# Patient Record
Sex: Male | Born: 1990 | Race: White | Hispanic: No | Smoking: Never smoker
Health system: Southern US, Community
[De-identification: ages and names within clinical notes are randomized; demographics above are authoritative.]

## PROBLEM LIST (undated history)

## (undated) DIAGNOSIS — K219 Gastro-esophageal reflux disease without esophagitis: Secondary | ICD-10-CM

## (undated) HISTORY — PX: TONSILLECTOMY: SUR1361

## (undated) HISTORY — DX: Gastro-esophageal reflux disease without esophagitis: K21.9

---

## 2019-10-10 ENCOUNTER — Emergency Department: Payer: Self-pay

## 2019-10-10 ENCOUNTER — Encounter: Payer: Self-pay | Admitting: Emergency Medicine

## 2019-10-10 ENCOUNTER — Other Ambulatory Visit: Payer: Self-pay

## 2019-10-10 ENCOUNTER — Emergency Department
Admission: EM | Admit: 2019-10-10 | Discharge: 2019-10-10 | Disposition: A | Discharge status: Home / Self Care | Payer: Self-pay | Attending: Emergency Medicine | Admitting: Emergency Medicine

## 2019-10-10 DIAGNOSIS — M940 Chondrocostal junction syndrome [Tietze]: Secondary | ICD-10-CM | POA: Insufficient documentation

## 2019-10-10 DIAGNOSIS — R0789 Other chest pain: Secondary | ICD-10-CM

## 2019-10-10 LAB — BASIC METABOLIC PANEL
Anion gap: 3 — ABNORMAL LOW (ref 5–15)
BUN: 19 mg/dL (ref 6–20)
CO2: 27 mmol/L (ref 22–32)
Calcium: 9.2 mg/dL (ref 8.9–10.3)
Chloride: 110 mmol/L (ref 98–111)
Creatinine, Ser: 0.9 mg/dL (ref 0.61–1.24)
GFR calc Af Amer: >60 mL/min (ref >60)
GFR calc non Af Amer: >60 mL/min (ref >60)
Glucose, Bld: 96 mg/dL (ref 70–99)
Potassium: 3.9 mmol/L (ref 3.5–5.1)
Sodium: 140 mmol/L (ref 135–145)

## 2019-10-10 LAB — CBC
HCT: 38.4 % — ABNORMAL LOW (ref 39.0–52.0)
Hemoglobin: 11.9 g/dL — ABNORMAL LOW (ref 13.0–17.0)
MCH: 25.2 pg — ABNORMAL LOW (ref 26.0–34.0)
MCHC: 31 g/dL (ref 30.0–36.0)
MCV: 81.4 fL (ref 80.0–100.0)
Platelets: 397 10*3/uL (ref 150–400)
RBC: 4.72 MIL/uL (ref 4.22–5.81)
RDW: 15.4 % (ref 11.5–15.5)
WBC: 11.1 10*3/uL — ABNORMAL HIGH (ref 4.0–10.5)
nRBC: 0 % (ref 0.0–0.2)

## 2019-10-10 LAB — BRAIN NATRIURETIC PEPTIDE: B Natriuretic Peptide: 15 pg/mL (ref 0.0–100.0)

## 2019-10-10 LAB — TROPONIN I (HIGH SENSITIVITY): Troponin I (High Sensitivity): 3 ng/L (ref <18)

## 2019-10-10 MED ORDER — METHOCARBAMOL 500 MG PO TABS: 500.0000 mg | ORAL_TABLET | Freq: Three times a day (TID) | ORAL | 0 refills | Status: DC | PRN

## 2019-10-10 MED ORDER — IBUPROFEN 600 MG PO TABS: 600.0000 mg | ORAL_TABLET | Freq: Three times a day (TID) | ORAL | 0 refills | Status: AC

## 2019-10-10 NOTE — ED Provider Notes (Signed)
[facility] Emergency Department Provider Note  ____________________________________________   First MD Initiated Contact with Patient 10/10/19 1857     (approximate)  I have reviewed the triage vital signs and the nursing notes.   HISTORY  Chief Complaint Chest Pain    HPI Christian Larsen is a 28 y.o. male here with chest pain.  The patient states that he was at work today when he experienced acute onset of a sharp, positional, reproducible anterior chest pain.  He states that he had a stomach bug over the weekend and had several episodes of nausea and diarrhea, but this had resolved for the last 24 hours.  He went to work today.  While at work, he states he recently started a new job at [employer] where he does frequent heavy lifting. He states the pain is sharp, reproducible, and worse w/ palpation and movement. No alleviating factors. No cough. No fever. Pain has been constant btu intermittently gets worse w/ movement. Mild pain upon inspiration. No leg swelling. No h/o DVT. No family ACS, PE.       History reviewed. No pertinent past medical history.  There are no active problems to display for this patient.   Past Surgical History:  Procedure Laterality Date  . TONSILLECTOMY      Prior to Admission medications   Medication Sig Start Date End Date Taking? Authorizing Provider  ibuprofen (ADVIL) 600 MG tablet Take 1 tablet (600 mg total) by mouth 3 (three) times daily for 7 days. 10/10/19 10/17/19  Shaune PollackIsaacs, Emeril Stille, MD  methocarbamol (ROBAXIN) 500 MG tablet Take 1 tablet (500 mg total) by mouth every 8 (eight) hours as needed for muscle spasms. 10/10/19   Shaune PollackIsaacs, Angad Nabers, MD    Allergies Doxycycline, Codeine, Keflex [cephalexin], and Tequin [gatifloxacin]  No family history on file.  Social History Social History   Tobacco Use  . Smoking status: Never Smoker  . Smokeless tobacco: Never Used  Substance Use Topics  . Alcohol use: Never   Frequency: Never  . Drug use: Never    Review of Systems  Review of Systems  Constitutional: Negative for chills, fatigue and fever.  HENT: Negative for sore throat.   Respiratory: Positive for chest tightness. Negative for shortness of breath.   Cardiovascular: Positive for chest pain.  Gastrointestinal: Negative for abdominal pain.  Genitourinary: Negative for flank pain.  Musculoskeletal: Negative for neck pain.  Skin: Negative for rash and wound.  Allergic/Immunologic: Negative for immunocompromised state.  Neurological: Negative for weakness and numbness.  Hematological: Does not bruise/bleed easily.     ____________________________________________  PHYSICAL EXAM:      VITAL SIGNS: ED Triage Vitals  Enc Vitals Group     BP 10/10/19 1704 137/85     Pulse Rate 10/10/19 1704 81     Resp 10/10/19 1704 20     Temp 10/10/19 1704 98.5 F (36.9 C)     Temp Source 10/10/19 1704 Oral     SpO2 10/10/19 1704 97 %     Weight 10/10/19 1700 (!) 330 lb (149.7 kg)     Height 10/10/19 1700 5\' 9"  (1.753 m)     Head Circumference --      Peak Flow --      Pain Score 10/10/19 1659 6     Pain Loc --      Pain Edu? --      Excl. in GC? --      Physical Exam Vitals signs and nursing note reviewed.  Constitutional:      General: He is not in acute distress.    Appearance: He is well-developed.  HENT:     Head: Normocephalic and atraumatic.  Eyes:     Conjunctiva/sclera: Conjunctivae normal.  Neck:     Musculoskeletal: Neck supple.  Cardiovascular:     Rate and Rhythm: Normal rate and regular rhythm.     Heart sounds: Normal heart sounds. No murmur. No friction rub.  Pulmonary:     Effort: Pulmonary effort is normal. No respiratory distress.     Breath sounds: No decreased breath sounds, wheezing or rales.  Chest:     Chest wall: Tenderness (Significant pinpoint intercostal TTP over left anterior chest walll) present.  Abdominal:     General: There is no distension.      Palpations: Abdomen is soft.     Tenderness: There is no abdominal tenderness.  Skin:    General: Skin is warm.     Capillary Refill: Capillary refill takes less than 2 seconds.  Neurological:     Mental Status: He is alert and oriented to person, place, and time.     Motor: No abnormal muscle tone.       ____________________________________________   LABS (all labs ordered are listed, but only abnormal results are displayed)  Labs Reviewed  BASIC METABOLIC PANEL - Abnormal; Notable for the following components:      Result Value   Anion gap 3 (*)    All other components within normal limits  CBC - Abnormal; Notable for the following components:   WBC 11.1 (*)    Hemoglobin 11.9 (*)    HCT 38.4 (*)    MCH 25.2 (*)    All other components within normal limits  BRAIN NATRIURETIC PEPTIDE  TROPONIN I (HIGH SENSITIVITY)    ____________________________________________  EKG: Normal sinus rhythm, VR 83. PR 144, QRS 86, QTc 434. No ischemic changes. No ST elevations or depressions ________________________________________  RADIOLOGY All imaging, including plain films, CT scans, and ultrasounds, independently reviewed by me, and interpretations confirmed via formal radiology reads.  ED MD interpretation:   CXR: No focal abnormality  Official radiology report(s): Dg Chest 2 View  Result Date: 10/10/2019 CLINICAL DATA:  28 year old male with chest pain. EXAM: CHEST - 2 VIEW COMPARISON:  None. FINDINGS: Top-normal cardiac size. There is diffuse vascular prominence. No focal consolidation, pleural effusion, or pneumothorax. No acute osseous pathology. IMPRESSION: Mild vascular congestion.  No focal consolidation. Electronically Signed   By: Anner Crete M.D.   On: 10/10/2019 18:03    ____________________________________________  PROCEDURES   Procedure(s) performed (including Critical Care):  Procedures  ____________________________________________  INITIAL IMPRESSION  / MDM / [place] / ED COURSE  As part of my medical decision making, I reviewed the following data within the electronic MEDICAL RECORD NUMBER Notes from prior ED visits and [place] Controlled Substance Database      *Taiden Raybourn was evaluated in Emergency Department on 10/10/2019 for the symptoms described in the history of present illness. He was evaluated in the context of the global COVID-19 pandemic, which necessitated consideration that the patient might be at risk for infection with the SARS-CoV-2 virus that causes COVID-19. Institutional protocols and algorithms that pertain to the evaluation of patients at risk for COVID-19 are in a state of rapid change based on information released by regulatory bodies including the CDC and federal and state organizations. These policies and algorithms were followed during the patient's care in the ED.  Some ED evaluations and interventions may be delayed as a result of limited staffing during the pandemic.*      Medical Decision Making: 28 year old male here with reproducible anterior chest wall pain.  I suspect this is due to increased activity at work with a new job, versus straining from recent GI illness.  His abdomen is soft and he has no ongoing abdominal pain.  His pain is sharp, reproducible, and correlates with movement.  Suspect costochondritis versus intercostal sprain.  His chest x-ray is clear with no evidence of pneumonia or pneumothorax.  He has no tachycardia, hypoxia, or evidence to suggest PE and he is PERC negative.  Troponin negative despite constant symptoms for greater than 12 hours making ACS unlikely.  Will treat for possible musculoskeletal etiology, discharged with outpatient follow-up.  Scheduled NSAIDs given.  ____________________________________________  FINAL CLINICAL IMPRESSION(S) / ED DIAGNOSES  Final diagnoses:  Costochondritis  Chest wall pain     MEDICATIONS GIVEN DURING THIS VISIT:  Medications - No data  to display   ED Discharge Orders         Ordered    ibuprofen (ADVIL) 600 MG tablet  3 times daily     10/10/19 1918    methocarbamol (ROBAXIN) 500 MG tablet  Every 8 hours PRN     10/10/19 1918           Note:  This document was prepared using Dragon voice recognition software and may include unintentional dictation errors.   Shaune Pollack, MD 10/10/19 2043

## 2019-10-10 NOTE — ED Triage Notes (Signed)
Pt in via POV from work, reports intermittent centralized chest pain x one day, reports associated nausea, and shortness of breath.  NAD noted at this time.

## 2019-10-10 NOTE — ED Notes (Signed)
Pt able to transfer from wheelchair to bed without assistance. Pt reporting pain is worse when he moves his head to the left or looks down. Pain is not reproducible and pt not sure if it is worse with all movement but rather just the two previously mentioned. Intermittent SOB per pt but not at this time. NO increased WOB. No cardiac hx.

## 2019-10-10 NOTE — ED Notes (Signed)
PT signed paper copy of dispo d/t no computer available.

## 2020-09-14 ENCOUNTER — Other Ambulatory Visit: Payer: Self-pay

## 2020-09-14 ENCOUNTER — Ambulatory Visit (LOCAL_COMMUNITY_HEALTH_CENTER): Payer: Self-pay

## 2020-09-14 DIAGNOSIS — Z23 Encounter for immunization: Secondary | ICD-10-CM

## 2020-09-14 NOTE — Progress Notes (Signed)
[employer] employee in for [employer] A vaccine. Richmond Campbell, RN

## 2021-04-17 ENCOUNTER — Emergency Department
Admission: EM | Admit: 2021-04-17 | Discharge: 2021-04-17 | Disposition: A | Discharge status: Home / Self Care | Payer: Self-pay | Attending: Emergency Medicine | Admitting: Emergency Medicine

## 2021-04-17 ENCOUNTER — Other Ambulatory Visit: Payer: Self-pay

## 2021-04-17 ENCOUNTER — Emergency Department: Payer: Self-pay

## 2021-04-17 DIAGNOSIS — L03116 Cellulitis of left lower limb: Secondary | ICD-10-CM | POA: Insufficient documentation

## 2021-04-17 DIAGNOSIS — L03119 Cellulitis of unspecified part of limb: Secondary | ICD-10-CM

## 2021-04-17 DIAGNOSIS — L03115 Cellulitis of right lower limb: Secondary | ICD-10-CM | POA: Insufficient documentation

## 2021-04-17 DIAGNOSIS — K219 Gastro-esophageal reflux disease without esophagitis: Secondary | ICD-10-CM | POA: Insufficient documentation

## 2021-04-17 LAB — CBC
HCT: 38.8 % — ABNORMAL LOW (ref 39.0–52.0)
Hemoglobin: 11.9 g/dL — ABNORMAL LOW (ref 13.0–17.0)
MCH: 24.7 pg — ABNORMAL LOW (ref 26.0–34.0)
MCHC: 30.7 g/dL (ref 30.0–36.0)
MCV: 80.5 fL (ref 80.0–100.0)
Platelets: 405 10*3/uL — ABNORMAL HIGH (ref 150–400)
RBC: 4.82 MIL/uL (ref 4.22–5.81)
RDW: 15.8 % — ABNORMAL HIGH (ref 11.5–15.5)
WBC: 11.9 10*3/uL — ABNORMAL HIGH (ref 4.0–10.5)
nRBC: 0 % (ref 0.0–0.2)

## 2021-04-17 LAB — BASIC METABOLIC PANEL
Anion gap: 9 (ref 5–15)
BUN: 19 mg/dL (ref 6–20)
CO2: 28 mmol/L (ref 22–32)
Calcium: 9.4 mg/dL (ref 8.9–10.3)
Chloride: 100 mmol/L (ref 98–111)
Creatinine, Ser: 0.85 mg/dL (ref 0.61–1.24)
GFR, Estimated: >60 mL/min (ref >60)
Glucose, Bld: 107 mg/dL — ABNORMAL HIGH (ref 70–99)
Potassium: 4.2 mmol/L (ref 3.5–5.1)
Sodium: 137 mmol/L (ref 135–145)

## 2021-04-17 LAB — TROPONIN I (HIGH SENSITIVITY): Troponin I (High Sensitivity): 8 ng/L (ref <18)

## 2021-04-17 MED ORDER — SULFAMETHOXAZOLE-TRIMETHOPRIM 800-160 MG PO TABS: 1.0000 | ORAL_TABLET | Freq: Two times a day (BID) | ORAL | 0 refills | Status: DC

## 2021-04-17 MED ORDER — AMOXICILLIN 875 MG PO TABS: 875.0000 mg | ORAL_TABLET | Freq: Two times a day (BID) | ORAL | 0 refills | Status: DC

## 2021-04-17 MED ORDER — METOCLOPRAMIDE HCL 10 MG PO TABS: 10.0000 mg | ORAL_TABLET | Freq: Four times a day (QID) | ORAL | 0 refills | Status: DC | PRN

## 2021-04-17 MED ORDER — PANTOPRAZOLE SODIUM 40 MG IV SOLR
40.0000 mg | Freq: Once | INTRAVENOUS | Status: AC
  Administered 2021-04-17: 40 mg via INTRAVENOUS
  Filled 2021-04-17: qty 40

## 2021-04-17 MED ORDER — METOCLOPRAMIDE HCL 5 MG/ML IJ SOLN
10.0000 mg | Freq: Once | INTRAMUSCULAR | Status: AC
  Administered 2021-04-17: 10 mg via INTRAVENOUS
  Filled 2021-04-17: qty 2

## 2021-04-17 MED ORDER — SUCRALFATE 1 G PO TABS
1.0000 g | ORAL_TABLET | Freq: Once | ORAL | Status: AC
  Administered 2021-04-17: 1 g via ORAL
  Filled 2021-04-17: qty 1

## 2021-04-17 MED ORDER — FAMOTIDINE 20 MG PO TABS
40.0000 mg | ORAL_TABLET | Freq: Once | ORAL | Status: AC
  Administered 2021-04-17: 40 mg via ORAL
  Filled 2021-04-17: qty 2

## 2021-04-17 MED ORDER — FAMOTIDINE 20 MG PO TABS
20.0000 mg | ORAL_TABLET | Freq: Two times a day (BID) | ORAL | 0 refills | Status: DC
Start: 2021-04-17 — End: 2023-10-29

## 2021-04-17 NOTE — ED Provider Notes (Signed)
[facility] Emergency Department Provider Note  ____________________________________________  Time seen: Approximately 1:12 PM  I have reviewed the triage vital signs and the nursing notes.   HISTORY  Chief Complaint Chest Pain (Reports started after first dose of cleocin for cellulitis)    HPI Christian Larsen is a 30 y.o. male with no significant past medical history who comes the ED complaining of chest pain  which started yesterday, approximately 12 hours ago.  Constant, nonradiating, no aggravating or alleviating factors.  Waxing and waning.  Not pleuritic, not exertional.  No shortness of breath diaphoresis or vomiting.  No palpitations dizziness or syncope.  Feels like burning, feels like GERD, but did not resolve with Tums like his GERD normally does.  Clindamycin was prescribed for him due to cellulitis of bilateral lower extremities.  He notes that he has had chronic lower extremity swelling which gets worse throughout the day while being up on his feet working at [employer].     No past medical history on file.   There are no problems to display for this patient.    Past Surgical History:  Procedure Laterality Date  . TONSILLECTOMY       Prior to Admission medications   Medication Sig Start Date End Date Taking? Authorizing Provider  amoxicillin (AMOXIL) 875 MG tablet Take 1 tablet (875 mg total) by mouth 2 (two) times daily. 04/17/21  Yes Sharman Cheek, MD  famotidine (PEPCID) 20 MG tablet Take 1 tablet (20 mg total) by mouth 2 (two) times daily. 04/17/21  Yes Sharman Cheek, MD  metoCLOPramide (REGLAN) 10 MG tablet Take 1 tablet (10 mg total) by mouth every 6 (six) hours as needed. 04/17/21  Yes Sharman Cheek, MD  sulfamethoxazole-trimethoprim (BACTRIM DS) 800-160 MG tablet Take 1 tablet by mouth 2 (two) times daily. 04/17/21  Yes Sharman Cheek, MD  methocarbamol (ROBAXIN) 500 MG tablet Take 1 tablet (500 mg total) by mouth  every 8 (eight) hours as needed for muscle spasms. 10/10/19   Shaune Pollack, MD     Allergies Doxycycline, Codeine, Keflex [cephalexin], and Tequin [gatifloxacin]   No family history on file.  Social History Social History   Tobacco Use  . Smoking status: Never Smoker  . Smokeless tobacco: Never Used  Vaping Use  . Vaping Use: Never used  Substance Use Topics  . Alcohol use: Never  . Drug use: Never    Review of Systems  Constitutional:   No fever or chills.  ENT:   No sore throat. No rhinorrhea. Cardiovascular: Positive chest pain without syncope. Respiratory:   No dyspnea or cough. Gastrointestinal:   Negative for abdominal pain, vomiting and diarrhea.  Musculoskeletal:   Chronic lower extremity swelling. All other systems reviewed and are negative except as documented above in ROS and HPI.  ____________________________________________   PHYSICAL EXAM:  VITAL SIGNS: ED Triage Vitals  Enc Vitals Group     BP 04/17/21 1026 114/66     Pulse Rate 04/17/21 1037 91     Resp 04/17/21 1026 20     Temp 04/17/21 1026 97.9 F (36.6 C)     Temp Source 04/17/21 1026 Oral     SpO2 04/17/21 1037 97 %     Weight 04/17/21 1026 (!) 367 lb (166.5 kg)     Height 04/17/21 1026 5\' 10"  (1.778 m)     Head Circumference --      Peak Flow --      Pain Score 04/17/21 1038 5  Pain Loc --      Pain Edu? --      Excl. in GC? --     Vital signs reviewed, nursing assessments reviewed.   Constitutional:   Alert and oriented. Non-toxic appearance.  Morbidly obese Eyes:   Conjunctivae are normal. EOMI. PERRL. ENT      Head:   Normocephalic and atraumatic.      Nose:   Wearing a mask.      Mouth/Throat:   Wearing a mask.      Neck:   No meningismus. Full ROM. Hematological/Lymphatic/Immunilogical:   No cervical lymphadenopathy. Cardiovascular:   RRR. Symmetric bilateral radial and DP pulses.  No murmurs. Cap refill less than 2 seconds. Respiratory:   Normal respiratory effort  without tachypnea/retractions. Breath sounds are clear and equal bilaterally. No wheezes/rales/rhonchi. Gastrointestinal:   Soft with mild left upper quadrant tenderness. Non distended. There is no CVA tenderness.  No rebound, rigidity, or guarding. Genitourinary:   deferred Musculoskeletal:   Normal range of motion in all extremities. No joint effusions.  Mild bilateral lower extremity tenderness with diffuse erythema from ankle to mid shin.  There is some warmth.  There is 1+ edema bilateral lower extremities, symmetric calf circumference.  No crepitus.  No streaking.  No fluctuance or purulent drainage. Neurologic:   Normal speech and language.  Motor grossly intact. No acute focal neurologic deficits are appreciated.  Skin:    Skin is warm, dry and intact.  Cellulitis changes as above.  No petechiae, purpura, or bullae.  ____________________________________________    LABS (pertinent positives/negatives) (all labs ordered are listed, but only abnormal results are displayed) Labs Reviewed  BASIC METABOLIC PANEL - Abnormal; Notable for the following components:      Result Value   Glucose, Bld 107 (*)    All other components within normal limits  CBC - Abnormal; Notable for the following components:   WBC 11.9 (*)    Hemoglobin 11.9 (*)    HCT 38.8 (*)    MCH 24.7 (*)    RDW 15.8 (*)    Platelets 405 (*)    All other components within normal limits  TROPONIN I (HIGH SENSITIVITY)   ____________________________________________   EKG  Interpreted by me  Date: 04/17/2021  Rate: 97  Rhythm: normal sinus rhythm  QRS Axis: normal  Intervals: normal  ST/T Wave abnormalities: normal  Conduction Disutrbances: none  Narrative Interpretation: unremarkable      ____________________________________________    RADIOLOGY  DG Chest 2 View  Result Date: 04/17/2021 CLINICAL DATA:  Mid chest pain. EXAM: CHEST - 2 VIEW COMPARISON:  10/10/2019 FINDINGS: Mild cardiac enlargement.  No pleural effusion. Diffuse pulmonary vascular congestion. No airspace densities. Visualized osseous structures are unremarkable. IMPRESSION: Cardiac enlargement and pulmonary vascular congestion. Electronically Signed   By: Signa Kell M.D.   On: 04/17/2021 11:28    ____________________________________________   PROCEDURES Procedures  ____________________________________________  DIFFERENTIAL DIAGNOSIS   GERD, non-STEMI, anxiety, pneumothorax  CLINICAL IMPRESSION / ASSESSMENT AND PLAN / ED COURSE  Medications ordered in the ED: Medications  pantoprazole (PROTONIX) injection 40 mg (40 mg Intravenous Given 04/17/21 1128)  sucralfate (CARAFATE) tablet 1 g (1 g Oral Given 04/17/21 1128)  famotidine (PEPCID) tablet 40 mg (40 mg Oral Given 04/17/21 1128)  metoCLOPramide (REGLAN) injection 10 mg (10 mg Intravenous Given 04/17/21 1129)    Pertinent labs & imaging results that were available during my care of the patient were reviewed by me and considered in my medical decision  making (see chart for details).  Nicoli Nardozzi was evaluated in Emergency Department on 04/17/2021 for the symptoms described in the history of present illness. He was evaluated in the context of the global COVID-19 pandemic, which necessitated consideration that the patient might be at risk for infection with the SARS-CoV-2 virus that causes COVID-19. Institutional protocols and algorithms that pertain to the evaluation of patients at risk for COVID-19 are in a state of rapid change based on information released by regulatory bodies including the CDC and federal and state organizations. These policies and algorithms were followed during the patient's care in the ED.   Patient presents with atypical chest pain after starting clindamycin since yesterday.  Patient is nontoxic and not septic.  Suitable for continued outpatient management.    Pain is noncardiac, vital signs are normal, EKG is normal, chest x-ray is normal.   Highly suspect GERD.  Will change antibiotics to amoxicillin and Bactrim at patient's request.  Pepcid and Reglan for symptom relief.  Patient strongly encouraged to follow-up with primary care doctor for ongoing management of the symptoms as well as evaluation for his chronic peripheral edema and metabolic syndrome.  Patient is motivated to lose weight and modify diet and increase exercise.  Lifestyle modification strategies and goals discussed with the patient for about 7 minutes.      ____________________________________________   FINAL CLINICAL IMPRESSION(S) / ED DIAGNOSES    Final diagnoses:  Gastroesophageal reflux disease, unspecified whether esophagitis present  Cellulitis of lower extremity, unspecified laterality     ED Discharge Orders         Ordered    sulfamethoxazole-trimethoprim (BACTRIM DS) 800-160 MG tablet  2 times daily        04/17/21 1311    amoxicillin (AMOXIL) 875 MG tablet  2 times daily        04/17/21 1311    famotidine (PEPCID) 20 MG tablet  2 times daily        04/17/21 1311    metoCLOPramide (REGLAN) 10 MG tablet  Every 6 hours PRN        04/17/21 1311          Portions of this note were generated with dragon dictation software. Dictation errors may occur despite best attempts at proofreading.   Sharman Cheek, MD 04/17/21 1318

## 2021-04-17 NOTE — Discharge Instructions (Signed)
Your lab tests are normal today. Stop taking clindamycin and start taking Amoxicillin and Bactrim to treat the cellulitis.  Take famotidine and metoclopramide as well to continue managing the acid reflux symptoms.

## 2021-04-17 NOTE — ED Triage Notes (Signed)
Pt reports sharp mid sternal chest pain that does not radiate after taking a second dose of cleocin that was prescribed for cellulitis, late yesterday evening.

## 2021-05-04 ENCOUNTER — Ambulatory Visit (INDEPENDENT_AMBULATORY_CARE_PROVIDER_SITE_OTHER): Payer: Self-pay | Admitting: Vascular Surgery

## 2021-05-04 ENCOUNTER — Other Ambulatory Visit: Payer: Self-pay

## 2021-05-04 DIAGNOSIS — M7989 Other specified soft tissue disorders: Secondary | ICD-10-CM

## 2021-05-04 DIAGNOSIS — L97211 Non-pressure chronic ulcer of right calf limited to breakdown of skin: Secondary | ICD-10-CM

## 2021-05-04 NOTE — Assessment & Plan Note (Signed)
3 layer Unna boots placed bilaterally today and will be changed weekly.  We will reassess in a few weeks with duplex.

## 2021-05-04 NOTE — Progress Notes (Signed)
Patient ID: Christian Larsen, male   DOB: 04/01/1991, 30 y.o.   MRN: [id number]  Chief Complaint  Patient presents with  . New Patient (Initial Visit)    New consult significant swelling BIL LE =Referred by wells     HPI Christian Larsen is a 30 y.o. male.  I am asked to see the patient by K. Wells for evaluation of leg swelling, discoloration and skin changes.  This affects both lower extremities but the right leg is a little worse than the left.  He denies any previous history of DVT or superficial thrombophlebitis to his knowledge.  No trauma or injury.  He has been treated for some significant back disease and was noted by his chiropractor that this was the case.  He has recently resumed playing golf but has been very inactive for several years.  The right leg is had some weeping and draining and has a small ulceration on the posterior calf area.  He was seen in the emergency department and started on antibiotics for possible cellulitis although his leg has more appearance of venous stasis discoloration and not cellulitis currently.  He has severe itching which is one of his primary complaints.   No past medical history on file.  Past Surgical History:  Procedure Laterality Date  . TONSILLECTOMY       Family History No bleeding or clotting disorders   Social History   Tobacco Use  . Smoking status: Never Smoker  . Smokeless tobacco: Never Used  Vaping Use  . Vaping Use: Never used  Substance Use Topics  . Alcohol use: Never  . Drug use: Never     Allergies  Allergen Reactions  . Doxycycline Hives and Shortness Of Breath  . Codeine Other (See Comments)    Unknown   . Keflex [Cephalexin] Other (See Comments)    Unknown  . Tequin [Gatifloxacin] Other (See Comments)    Unknown    Current Outpatient Medications  Medication Sig Dispense Refill  . amoxicillin (AMOXIL) 875 MG tablet Take 1 tablet (875 mg total) by mouth 2 (two) times daily. 14 tablet 0  . famotidine  (PEPCID) 20 MG tablet Take 1 tablet (20 mg total) by mouth 2 (two) times daily. 60 tablet 0  . methocarbamol (ROBAXIN) 500 MG tablet Take 1 tablet (500 mg total) by mouth every 8 (eight) hours as needed for muscle spasms. 21 tablet 0  . metoCLOPramide (REGLAN) 10 MG tablet Take 1 tablet (10 mg total) by mouth every 6 (six) hours as needed. 30 tablet 0  . sulfamethoxazole-trimethoprim (BACTRIM DS) 800-160 MG tablet Take 1 tablet by mouth 2 (two) times daily. 14 tablet 0   No current facility-administered medications for this visit.      REVIEW OF SYSTEMS (Negative unless checked)  Constitutional: [] Weight loss  [] Fever  [] Chills Cardiac: [] Chest pain   [] Chest pressure   [] Palpitations   [] Shortness of breath when laying flat   [] Shortness of breath at rest   [] Shortness of breath with exertion. Vascular:  [] Pain in legs with walking   [] Pain in legs at rest   [] Pain in legs when laying flat   [] Claudication   [] Pain in feet when walking  [] Pain in feet at rest  [] Pain in feet when laying flat   [] History of DVT   [] Phlebitis   [x] Swelling in legs   [] Varicose veins   [] Non-healing ulcers Pulmonary:   [] Uses home oxygen   [] Productive cough   [] Hemoptysis   [] Wheeze  []   COPD   [] Asthma Neurologic:  [] Dizziness  [] Blackouts   [] Seizures   [] History of stroke   [] History of TIA  [] Aphasia   [] Temporary blindness   [] Dysphagia   [] Weakness or numbness in arms   [] Weakness or numbness in legs Musculoskeletal:  [] Arthritis   [] Joint swelling   [] Joint pain   [] Low back pain Hematologic:  [] Easy bruising  [] Easy bleeding   [] Hypercoagulable state   [] Anemic  [] Hepatitis Gastrointestinal:  [] Blood in stool   [] Vomiting blood  [] Gastroesophageal reflux/heartburn   [] Abdominal pain Genitourinary:  [] Chronic kidney disease   [] Difficult urination  [] Frequent urination  [] Burning with urination   [] Hematuria Skin:  [] Rashes   [x] Ulcers   [x] Wounds Psychological:  [] History of anxiety   []  History of major  depression.    Physical Exam BP 126/73   Pulse 79   Ht 5\' 9"  (1.753 m)   Wt (!) 349 lb (158.3 kg)   BMI 51.54 kg/m  Gen:  WD/WN, NAD. Morbidly obese Head: [place]/AT, No temporalis wasting.  Ear/Nose/Throat: Hearing grossly intact, nares w/o erythema or drainage, oropharynx w/o Erythema/Exudate Eyes: Conjunctiva clear, sclera non-icteric  Neck: trachea midline.  No JVD.  Pulmonary:  Good air movement, respirations not labored, no use of accessory muscles  Cardiac: RRR, no JVD Vascular:  Vessel Right Left  Radial Palpable Palpable                                   Gastrointestinal:. No masses, surgical incisions, or scars. Musculoskeletal: M/S 5/5 throughout.  Extremities without ischemic changes.  No deformity or atrophy.  Dry scaling skin with stasis dermatitis changes present bilaterally.  He has a small ulceration on the posterior right calf that smaller than a dime.  There are some small scabs on both lower extremities throughout.  2-3+ bilateral lower extremity edema. Neurologic: Sensation grossly intact in extremities.  Symmetrical.  Speech is fluent. Motor exam as listed above. Psychiatric: Judgment intact, Mood & affect appropriate for pt's clinical situation. Dermatologic: No rashes or ulcers noted.  No cellulitis or open wounds.    Radiology DG Chest 2 View  Result Date: 04/17/2021 CLINICAL DATA:  Mid chest pain. EXAM: CHEST - 2 VIEW COMPARISON:  10/10/2019 FINDINGS: Mild cardiac enlargement. No pleural effusion. Diffuse pulmonary vascular congestion. No airspace densities. Visualized osseous structures are unremarkable. IMPRESSION: Cardiac enlargement and pulmonary vascular congestion. Electronically Signed   By: M.D.   On: 04/17/2021 11:28    Labs Recent Results (from the past 2160 hour(s))  Basic metabolic panel     Status: Abnormal   Collection Time: 04/17/21 10:44 AM  Result Value Ref Range   Sodium 137 135 - 145 mmol/L   Potassium 4.2 3.5 -  5.1 mmol/L   Chloride 100 98 - 111 mmol/L   CO2 28 22 - 32 mmol/L   Glucose, Bld 107 (H) 70 - 99 mg/dL    Comment: Glucose reference range applies only to samples taken after fasting for at least 8 hours.   BUN 19 6 - 20 mg/dL   Creatinine, Ser 0.61 - 1.24 mg/dL   Calcium 9.4 8.9 - mg/dL   GFR, Estimated  mL/min    Comment: (NOTE) Calculated using the CKD-EPI Creatinine Equation (2021)    Anion gap 9 5 - 15    Comment: Performed at [facility], [address] [place],   CBC  Status: Abnormal   Collection Time: 04/17/21 10:44 AM  Result Value Ref Range   WBC 11.9 (H) 4.0 - 10.5 K/uL   RBC 4.82 4.22 - 5.81 MIL/uL   Hemoglobin 11.9 (L) 13.0 - 17.0 g/dL   HCT 31.5 (L) 40.0 - 86.7 %   MCV 80.5 80.0 - 100.0 fL   MCH 24.7 (L) 26.0 - 34.0 pg   MCHC 30.7 30.0 - 36.0 g/dL   RDW 61.9 (H) 50.9 - 32.6 %   Platelets 405 (H) 150 - 400 K/uL   nRBC 0.0 0.0 - 0.2 %    Comment: Performed at [facility], [address] [place], [place] [address]  Troponin I (High Sensitivity)     Status: None   Collection Time: 04/17/21 10:44 AM  Result Value Ref Range   Troponin I (High Sensitivity) 8 <18 ng/L    Comment: (NOTE) Elevated high sensitivity troponin I (hsTnI) values and significant  changes across serial measurements may suggest ACS but many other  chronic and acute conditions are known to elevate hsTnI results.  Refer to the "Links" section for chest pain algorithms and additional  guidance. Performed at [facility], [address] [place], [place] [address]     Assessment/Plan:  Swelling of limb I have had a long discussion with the patient regarding swelling and why it  causes symptoms.  Patient was wrapped in 3 layer Unna boots bilaterally today and these will be changed weekly.  Once we come out of Unna boots, the patient will  beginning wearing the stockings first thing in the morning and removing them in the  evening. The patient is instructed specifically not to sleep in the stockings.   In addition, behavioral modification will be initiated.  This will include frequent elevation, use of over the counter pain medications and exercise such as walking.  I have reviewed systemic causes for chronic edema such as liver, kidney and cardiac etiologies.  The patient denies problems with these organ systems.    Consideration for a lymph pump will also be made based upon the effectiveness of conservative therapy.  This would help to improve the edema control and prevent sequela such as ulcers and infections   Patient should undergo duplex ultrasound of the venous system to ensure that DVT or reflux is not present.  The patient will follow-up with me after the ultrasound.    Lower limb ulcer, calf, right, limited to breakdown of skin (HCC) 3 layer Unna boots placed bilaterally today and will be changed weekly.  We will reassess in a few weeks with duplex.  Obesity, morbid (HCC) Weight loss and exercise would be of benefit for reducing his leg swelling.      Festus Barren 05/04/2021, 2:34 PM   This note was created with Dragon medical transcription system.  Any errors from dictation are unintentional.

## 2021-05-04 NOTE — Assessment & Plan Note (Signed)
I have had a long discussion with the patient regarding swelling and why it  causes symptoms.  Patient was wrapped in 3 layer Unna boots bilaterally today and these will be changed weekly.  Once we come out of Unna boots, the patient will  beginning wearing the stockings first thing in the morning and removing them in the evening. The patient is instructed specifically not to sleep in the stockings.   In addition, behavioral modification will be initiated.  This will include frequent elevation, use of over the counter pain medications and exercise such as walking.  I have reviewed systemic causes for chronic edema such as liver, kidney and cardiac etiologies.  The patient denies problems with these organ systems.    Consideration for a lymph pump will also be made based upon the effectiveness of conservative therapy.  This would help to improve the edema control and prevent sequela such as ulcers and infections   Patient should undergo duplex ultrasound of the venous system to ensure that DVT or reflux is not present.  The patient will follow-up with me after the ultrasound.

## 2021-05-04 NOTE — Assessment & Plan Note (Signed)
Weight loss and exercise would be of benefit for reducing his leg swelling.

## 2021-05-12 ENCOUNTER — Encounter (INDEPENDENT_AMBULATORY_CARE_PROVIDER_SITE_OTHER): Payer: Self-pay | Admitting: Nurse Practitioner

## 2021-05-12 ENCOUNTER — Ambulatory Visit (INDEPENDENT_AMBULATORY_CARE_PROVIDER_SITE_OTHER): Payer: Self-pay | Admitting: Nurse Practitioner

## 2021-05-12 ENCOUNTER — Other Ambulatory Visit: Payer: Self-pay

## 2021-05-12 VITALS — BP 137/81 | HR 93 | Ht 70.0 in | Wt 350.0 lb

## 2021-05-12 DIAGNOSIS — L97211 Non-pressure chronic ulcer of right calf limited to breakdown of skin: Secondary | ICD-10-CM

## 2021-05-12 NOTE — Progress Notes (Signed)
History of Present Illness  There is no documented history at this time  Assessments & Plan   There are no diagnoses linked to this encounter.    Additional instructions  Subjective:  Patient presents with venous ulcer of the Bilateral lower extremity.    Procedure:  3 layer unna wrap was placed Bilateral lower extremity.   Plan:   Follow up in one week.  

## 2021-05-19 ENCOUNTER — Other Ambulatory Visit: Payer: Self-pay

## 2021-05-19 ENCOUNTER — Ambulatory Visit (INDEPENDENT_AMBULATORY_CARE_PROVIDER_SITE_OTHER): Payer: Self-pay | Admitting: Nurse Practitioner

## 2021-05-19 DIAGNOSIS — M7989 Other specified soft tissue disorders: Secondary | ICD-10-CM

## 2021-05-19 NOTE — Progress Notes (Signed)
History of Present Illness  There is no documented history at this time  Assessments & Plan   There are no diagnoses linked to this encounter.    Additional instructions  Subjective:  Patient presents with venous ulcer of the Bilateral lower extremity.    Procedure:  3 layer unna wrap was placed Bilateral lower extremity.   Plan:   Follow up in one week.  

## 2021-05-24 ENCOUNTER — Encounter (INDEPENDENT_AMBULATORY_CARE_PROVIDER_SITE_OTHER): Payer: Self-pay | Admitting: Nurse Practitioner

## 2021-05-26 ENCOUNTER — Ambulatory Visit (INDEPENDENT_AMBULATORY_CARE_PROVIDER_SITE_OTHER): Payer: Self-pay | Admitting: Nurse Practitioner

## 2021-05-26 ENCOUNTER — Other Ambulatory Visit: Payer: Self-pay

## 2021-05-26 VITALS — BP 143/86 | HR 91 | Ht 69.0 in | Wt 354.0 lb

## 2021-05-26 DIAGNOSIS — L97211 Non-pressure chronic ulcer of right calf limited to breakdown of skin: Secondary | ICD-10-CM

## 2021-05-26 NOTE — Progress Notes (Signed)
History of Present Illness  There is no documented history at this time  Assessments & Plan   There are no diagnoses linked to this encounter.    Additional instructions  Subjective:  Patient presents with venous ulcer of the Bilateral lower extremity.    Procedure:  3 layer unna wrap was placed Bilateral lower extremity.   Plan:   Follow up in one week.  

## 2021-05-30 ENCOUNTER — Encounter (INDEPENDENT_AMBULATORY_CARE_PROVIDER_SITE_OTHER): Payer: Self-pay | Admitting: Nurse Practitioner

## 2021-06-02 ENCOUNTER — Other Ambulatory Visit: Payer: Self-pay

## 2021-06-02 ENCOUNTER — Ambulatory Visit (INDEPENDENT_AMBULATORY_CARE_PROVIDER_SITE_OTHER): Payer: Self-pay

## 2021-06-02 ENCOUNTER — Ambulatory Visit (INDEPENDENT_AMBULATORY_CARE_PROVIDER_SITE_OTHER): Payer: Self-pay | Admitting: Nurse Practitioner

## 2021-06-02 DIAGNOSIS — L97211 Non-pressure chronic ulcer of right calf limited to breakdown of skin: Secondary | ICD-10-CM

## 2021-06-02 DIAGNOSIS — R600 Localized edema: Secondary | ICD-10-CM

## 2021-06-02 DIAGNOSIS — M7989 Other specified soft tissue disorders: Secondary | ICD-10-CM

## 2021-06-07 ENCOUNTER — Other Ambulatory Visit: Payer: Self-pay

## 2021-06-07 ENCOUNTER — Encounter: Payer: Self-pay | Admitting: Cardiovascular Disease

## 2021-06-07 ENCOUNTER — Ambulatory Visit (INDEPENDENT_AMBULATORY_CARE_PROVIDER_SITE_OTHER): Payer: Self-pay | Admitting: Cardiovascular Disease

## 2021-06-07 VITALS — BP 140/100 | HR 96 | Ht 69.0 in | Wt 358.0 lb

## 2021-06-07 DIAGNOSIS — M7989 Other specified soft tissue disorders: Secondary | ICD-10-CM

## 2021-06-07 DIAGNOSIS — R079 Chest pain, unspecified: Secondary | ICD-10-CM

## 2021-06-07 NOTE — Progress Notes (Signed)
Cardiology Office Note  Date:  06/07/2021   ID:  Christian Larsen, DOB Nov 23, 1991, MRN [id number]  PCP:  Patient, No Pcp Per (Inactive)   Chief Complaint  Patient presents with   New Patient (Initial Visit)    Ref by Dr. Wyn Quaker for LE edema. Patient c/o LE edema, shortness of breath and chest pain off & on for the past 3 months. Medications reviewed by the patient verbally.     HPI:   Christian Larsen is a 30 year old gentleman with history of Morbid obesity Chronic lower extremity edema Who presents by referral from Dr. Wyn Quaker for consultation of his lower extremity edema, shortness of breath, chest pain  Christian Larsen reports having worsening leg swelling, shortness of breath, prior history of chest pain on and off past 3 months  Seen in urgent care April 17, 2021 for chest pain Chest x-ray done indicating cardiac enlargement, pulmonary vascular congestion Troponin negative "Reaction to clindamycin" No more chest pain since that time  When see by Dr. Wyn Quaker, add swelling right lower extremity,  weeping and draining of small ulceration on the posterior calf area.   started on antibiotics by the emergency room for possible cellulitis although vein and vascular felt his leg has more appearance of venous stasis discoloration and not cellulitis currently -He did not respond well to clindamycin, had reaction as above  BP with Dr. Wyn Quaker 126/73 Normally low at home  No further chest pain, active, works at [employer] On his feet all day  EKG personally reviewed by myself on todays visit NSR rate no ST or T wave changes   PMH:   has a past medical history of GERD (gastroesophageal reflux disease).  PSH:    Past Surgical History:  Procedure Laterality Date   TONSILLECTOMY      Current Outpatient Medications  Medication Sig Dispense Refill   famotidine (PEPCID) 20 MG tablet Take 1 tablet (20 mg total) by mouth 2 (two) times daily. 60 tablet 0   methocarbamol (ROBAXIN) 500 MG tablet  Take 1 tablet (500 mg total) by mouth every 8 (eight) hours as needed for muscle spasms. 21 tablet 0   metoCLOPramide (REGLAN) 10 MG tablet Take 1 tablet (10 mg total) by mouth every 6 (six) hours as needed. 30 tablet 0   sulfamethoxazole-trimethoprim (BACTRIM DS) 800-160 MG tablet Take 1 tablet by mouth 2 (two) times daily. 14 tablet 0   No current facility-administered medications for this visit.     Allergies:   Doxycycline, Codeine, Keflex [cephalexin], and Tequin [gatifloxacin]   Social History:  The patient  reports that he has never smoked. He has never used smokeless tobacco. He reports that he does not drink alcohol and does not use drugs.   Family History:   family history includes Heart attack (age of onset: 67) in his mother; Hyperlipidemia in his mother; Hypertension in his mother.    Review of Systems: Review of Systems  Constitutional: Negative.   Respiratory:  Positive for shortness of breath.   Cardiovascular:  Positive for leg swelling.  Gastrointestinal: Negative.   Musculoskeletal: Negative.   Neurological: Negative.   Psychiatric/Behavioral: Negative.    All other systems reviewed and are negative.   PHYSICAL EXAM: VS:  BP (!) 140/100 (BP Location: Right Arm, Patient Position: Sitting, Cuff Size: Large)   Pulse 96   Ht 5\' 9"  (1.753 m)   Wt (!) 358 lb (162.4 kg)   SpO2 98%   BMI 52.87 kg/m  , BMI Body mass  index is 52.87 kg/m. GEN: Well nourished, well developed, in no acute distress HEENT: normal Neck: no JVD, carotid bruits, or masses Cardiac: RRR; no murmurs, rubs, or gallops,no edema  Respiratory:  clear to auscultation bilaterally, normal work of breathing GI: soft, nontender, nondistended, + BS MS: no deformity or atrophy Skin: warm and dry, no rash Neuro:  Strength and sensation are intact Psych: euthymic mood, full affect  Recent Labs: 04/17/2021: BUN 19; Creatinine, Ser 0.85; Hemoglobin 11.9; Platelets 405; Potassium 4.2; Sodium 137     Lipid Panel No results found for: CHOL, HDL, LDLCALC, TRIG    Wt Readings from Last 3 Encounters:  06/07/21 (!) 358 lb (162.4 kg)  05/26/21 (!) 354 lb (160.6 kg)  05/12/21 (!) 350 lb (158.8 kg)      ASSESSMENT AND PLAN:  Problem List Items Addressed This Visit   None Visit Diagnoses     Chest pain of uncertain etiology    -  Primary   Morbid obesity (HCC)       Leg swelling          Morbid obesity We have encouraged continued exercise, careful diet management in an effort to lose weight. Likely contributing to lower extremity swelling  Leg swelling Stable, wounds have healed, Scheduled to receive results back on his venous Doppler in the near future  Chest pain Atypical in nature, after taking clindamycin No further work-up at this time, low risk, Non-smoker, no diabetes  Shortness of breath Echocardiogram ordered , also in the setting of his leg swelling Likely from conditioning, high weight   Total encounter time more than 45 minutes  Greater than 50% was spent in counseling and coordination of care with the patient    Signed, Dossie Arbour, M.D., Ph.D. [facility] [place], [place] [phone]

## 2021-06-07 NOTE — Patient Instructions (Addendum)
Medication Instructions:  No changes  If you need a refill on your cardiac medications before your next appointment, please call your pharmacy.   Lab work: No new labs needed  Testing/Procedures: Echo (leg swelling, chest pain)  Your physician has requested that you have an echocardiogram. Echocardiography is a painless test that uses sound waves to create images of your heart. It provides your doctor with information about the size and shape of your heart and how well your heart's chambers and valves are working. This procedure takes approximately one hour. There are no restrictions for this procedure.  There is a possibility that an IV may need to be started during your test to inject an image enhancing agent. This is done to obtain more optimal pictures of your heart. Therefore we ask that you do at least drink some water prior to coming in to hydrate your veins.     Follow-Up: At [facility], you and your health needs are our priority.  As part of our continuing mission to provide you with exceptional heart care, we have created designated Provider Care Teams.  These Care Teams include your primary Cardiologist (physician) and Advanced Practice Providers (APPs -  Physician Assistants and Nurse Practitioners) who all work together to provide you with the care you need, when you need it.  You will need a follow up appointment as needed  Providers on your designated Care Team:   Nicolasa Ducking, NP Eula Listen, PA-C Marisue Ivan, PA-C   COVID-19 Vaccine Information can be found at: [email or web address] For questions related to vaccine distribution or appointments, please email [email or web address] or call [phone].

## 2021-07-22 ENCOUNTER — Ambulatory Visit (INDEPENDENT_AMBULATORY_CARE_PROVIDER_SITE_OTHER): Payer: Self-pay

## 2021-07-22 ENCOUNTER — Other Ambulatory Visit: Payer: Self-pay

## 2021-07-22 DIAGNOSIS — M7989 Other specified soft tissue disorders: Secondary | ICD-10-CM

## 2021-07-22 DIAGNOSIS — R079 Chest pain, unspecified: Secondary | ICD-10-CM

## 2021-07-22 MED ORDER — PERFLUTREN LIPID MICROSPHERE
1.0000 mL | INTRAVENOUS | Status: AC | PRN
  Administered 2021-07-22: 2 mL via INTRAVENOUS

## 2021-07-24 LAB — ECHOCARDIOGRAM COMPLETE
AR max vel: 3.44 cm2
AV Area VTI: 3.35 cm2
AV Area mean vel: 3.35 cm2
AV Mean grad: 4 mmHg
AV Peak grad: 6.3 mmHg
Ao pk vel: 1.25 m/s
Area-P 1/2: 4.41 cm2
S' Lateral: 3.6 cm

## 2021-08-02 ENCOUNTER — Telehealth: Payer: Self-pay | Admitting: *Deleted

## 2021-08-02 NOTE — Telephone Encounter (Signed)
-----   Message from Antonieta Iba, MD sent at 08/02/2021 12:53 PM EDT ----- Echo Low normal EF Normal pressures Leg swelling less likely from cardiac etiology

## 2021-08-02 NOTE — Telephone Encounter (Signed)
Left voicemail message to call back for review of results.  

## 2021-08-02 NOTE — Telephone Encounter (Signed)
Spoke with patient and reviewed results of echo and he then wanted to know what could be causing his swelling. Reviewed that Dr. Mariah Milling does not feel it is from a cardiac perspective. Inquired if he has a primary care provider and he does not. Provided him with number to call to assist with finding a primary care provider so that he can have a complete work up. He also received card in mail and was not sure if that would help cover some of his bill. Advised that he should call number on bill and provide them with information on that card and they can assist to see if it covers his services. His last set of labs were in April but other than that it was 2020. Expressed the importance of having a primary care provider for further treatment. He verbalized understanding of our conversation and will give the number a call.

## 2022-10-24 ENCOUNTER — Encounter (INDEPENDENT_AMBULATORY_CARE_PROVIDER_SITE_OTHER): Payer: Self-pay

## 2022-12-12 IMAGING — CR DG CHEST 2V
2 series · 2 of 2 positions shown · non-contrast
Comparison: 10/10/2019

CLINICAL DATA: Mid chest pain.

EXAM:
CHEST - 2 VIEW

[chest pa]
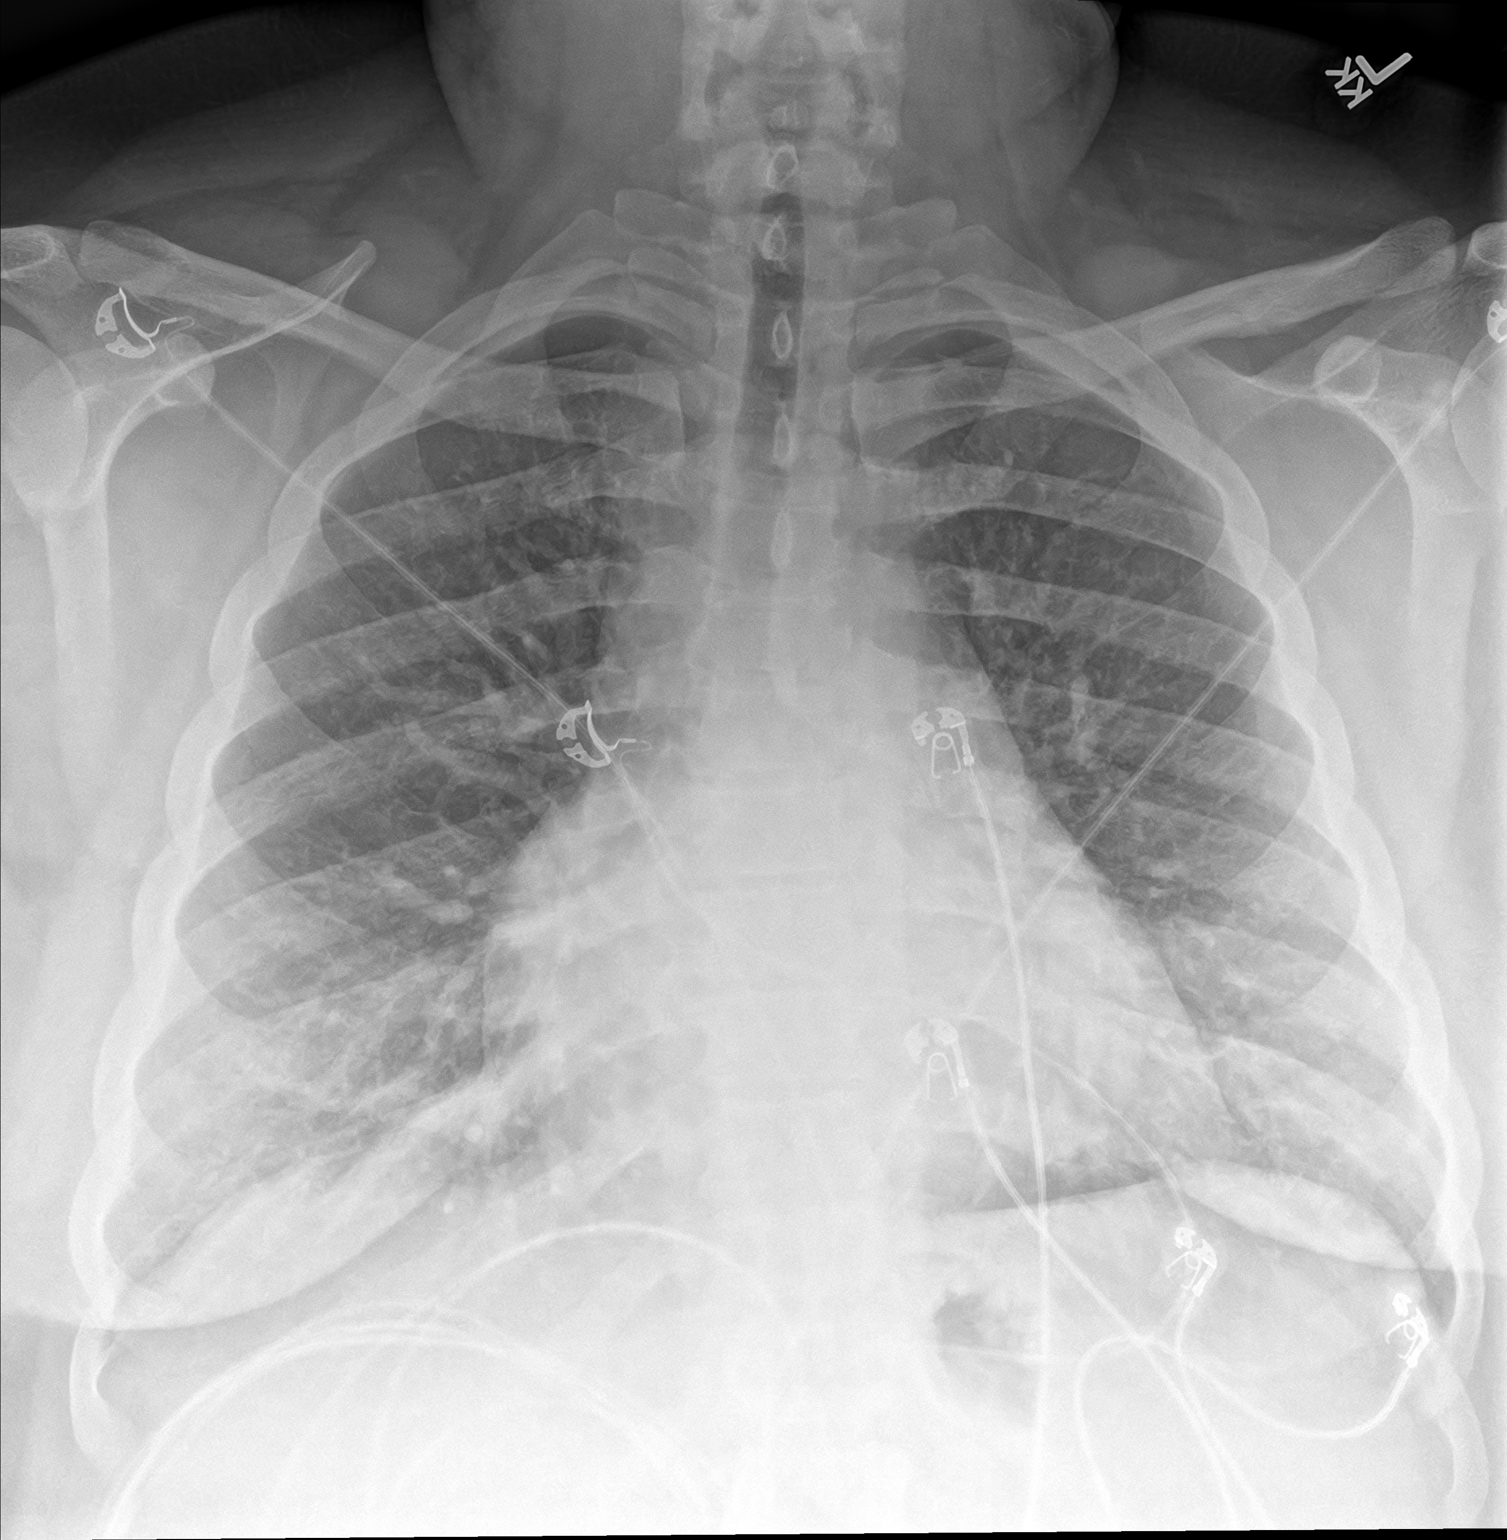

[chest lat]
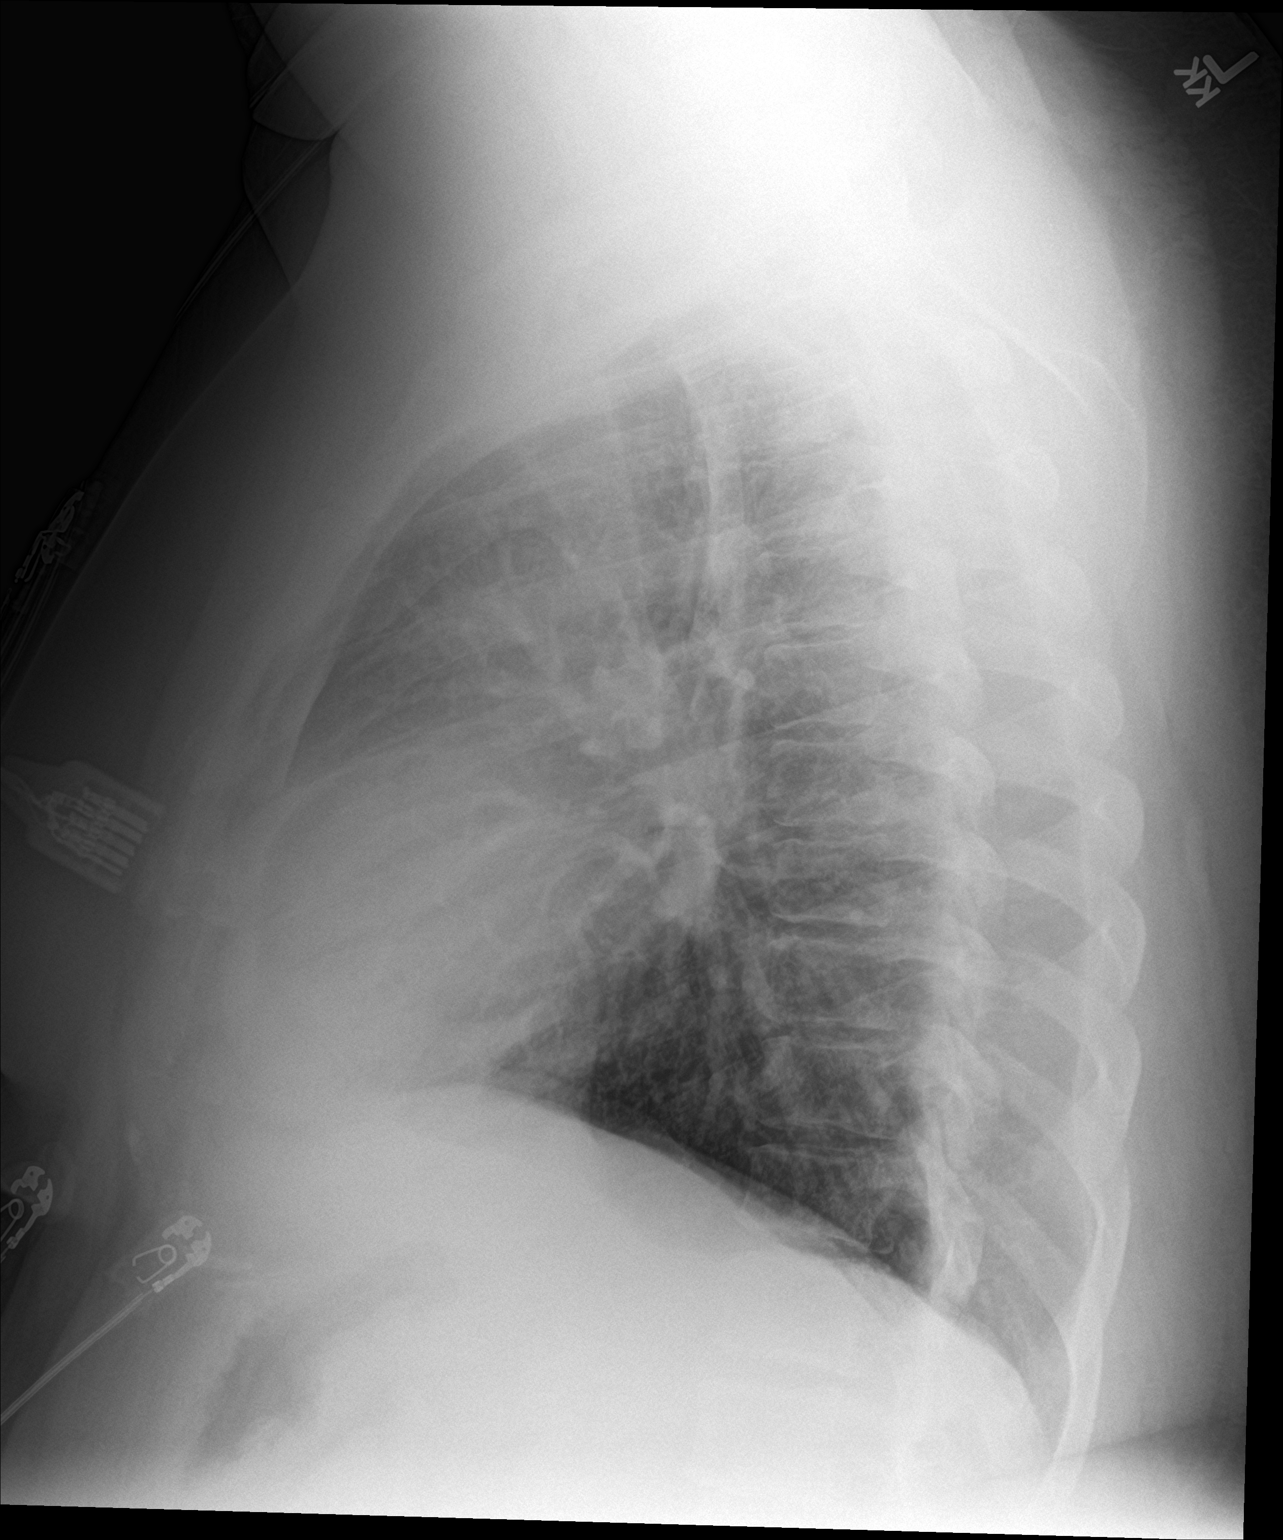

[2 of 2 positions shown; findings below may reference images not displayed]

FINDINGS: Mild cardiac enlargement. No pleural effusion. Diffuse pulmonary
vascular congestion. No airspace densities. Visualized osseous
structures are unremarkable.
IMPRESSION: Cardiac enlargement and pulmonary vascular congestion.

## 2023-10-29 ENCOUNTER — Ambulatory Visit
Admission: EM | Admit: 2023-10-29 | Discharge: 2023-10-29 | Disposition: A | Discharge status: Home / Self Care | Payer: Self-pay | Attending: Emergency Medicine | Admitting: Emergency Medicine

## 2023-10-29 ENCOUNTER — Encounter: Payer: Self-pay | Admitting: Emergency Medicine

## 2023-10-29 DIAGNOSIS — N485 Ulcer of penis: Secondary | ICD-10-CM

## 2023-10-29 MED ORDER — SULFAMETHOXAZOLE-TRIMETHOPRIM 800-160 MG PO TABS: 1.0000 | ORAL_TABLET | Freq: Two times a day (BID) | ORAL | 0 refills | Status: AC

## 2023-10-29 MED ORDER — CLOTRIMAZOLE-BETAMETHASONE 1-0.05 % EX CREA: TOPICAL_CREAM | CUTANEOUS | 0 refills | Status: AC

## 2023-10-29 NOTE — ED Triage Notes (Signed)
Patient reports tender bump on the bottom shaft of his penis for a week.  Patient unsure of fevers.

## 2023-10-29 NOTE — ED Provider Notes (Signed)
[facility]    CSN: [id number] Arrival date & time: 10/29/23  9562      History   Chief Complaint Chief Complaint  Patient presents with   Abscess    HPI Christian Larsen is a 32 y.o. male.   HPI  32 year old male with a past medical history significant for obesity, limb swelling, and GERD presents for evaluation of a tender bump on the underside of his penile shaft that has been present for the last week.  He reports that the lesion is located in his mid shaft and is not associated with any fever or drainage.  He denies any pain with urination or penile discharge.  No known injury.  He is concerned it might be an ingrown hair as he does shave his genitals.  He denies concern for sexually transmitted infections as he and his partner are monogamous and neither 1 has been with another person sexually.  Past Medical History:  Diagnosis Date   GERD (gastroesophageal reflux disease)     Patient Active Problem List   Diagnosis Date Noted   Swelling of limb 05/04/2021   Lower limb ulcer, calf, right, limited to breakdown of skin (HCC) 05/04/2021   Obesity, morbid (HCC) 05/04/2021    Past Surgical History:  Procedure Laterality Date   TONSILLECTOMY         Home Medications    Prior to Admission medications   Medication Sig Start Date End Date Taking? Authorizing Provider  clotrimazole-betamethasone (LOTRISONE) cream Apply to affected area 2 times daily prn 10/29/23  Yes Becky Augusta, NP  sulfamethoxazole-trimethoprim (BACTRIM DS) 800-160 MG tablet Take 1 tablet by mouth 2 (two) times daily for 7 days. 10/29/23 11/05/23 Yes Becky Augusta, NP    Family History Family History  Problem Relation Age of Onset   Hyperlipidemia Mother    Hypertension Mother    Heart attack Mother 58    Social History Social History   Tobacco Use   Smoking status: Never   Smokeless tobacco: Never  Vaping Use   Vaping status: Never Used  Substance Use Topics   Alcohol use:  Never   Drug use: Never     Allergies   Doxycycline, Codeine, Keflex [cephalexin], and Tequin [gatifloxacin]   Review of Systems Review of Systems  Constitutional:  Negative for fever.  Genitourinary:  Positive for genital sores and penile pain. Negative for dysuria, frequency, penile discharge, penile swelling, testicular pain and urgency.  Skin:  Positive for color change and wound.     Physical Exam Triage Vital Signs ED Triage Vitals  Encounter Vitals Group     BP      Systolic BP Percentile      Diastolic BP Percentile      Pulse      Resp      Temp      Temp src      SpO2      Weight      Height      Head Circumference      Peak Flow      Pain Score      Pain Loc      Pain Education      Exclude from Growth Chart    No data found.  Updated Vital Signs BP (!) 143/84 (BP Location: Right Arm)   Pulse 83   Temp 98.4 F (36.9 C) (Oral)   Resp 16   Ht 5\' 9"  (1.753 m)   Wt (!) 375 lb (170.1  kg)   SpO2 97%   BMI 55.38 kg/m   Visual Acuity Right Eye Distance:   Left Eye Distance:   Bilateral Distance:    Right Eye Near:   Left Eye Near:    Bilateral Near:     Physical Exam Vitals and nursing note reviewed.  Constitutional:      Appearance: Normal appearance. He is obese. He is not ill-appearing.  Genitourinary:    Testes: Normal.     Comments: There is a 2 mm ulcer on the ventral surface of the penis at midshaft. Neurological:     Mental Status: He is alert.      UC Treatments / Results  Labs (all labs ordered are listed, but only abnormal results are displayed) Labs Reviewed - No data to display  EKG   Radiology No results found.  Procedures Procedures (including critical care time)  Medications Ordered in UC Medications - No data to display  Initial Impression / Assessment and Plan / UC Course  I have reviewed the triage vital signs and the nursing notes.  Pertinent labs & imaging results that were available during my care  of the patient were reviewed by me and considered in my medical decision making (see chart for details).   Patient is a nontoxic-appearing 32 year old male presenting for evaluation of a swollen, painful area on his penile shaft that he noticed a week ago.  The patient is obese and his penis does retract into his skin folds.  Upon extracting his penis from his skin fold there is no abnormality noted to the glans penis or the dorsal aspect of the penile shaft.  On the ventral aspect there is a small 2 mm ulcer in the midshaft.  The ulcer does not appear to be HPV or HSV.  It also does not appear to be an ingrown hair as the patient was concerned about due to shaving his genitals.  I suspect that the ulceration is secondary to skin folds rubbing together.  There is some surrounding erythema and a scant amount of yellow slough in the ulcer bed.  I will cover him for potential bacterial infection with Bactrim DS twice daily for 7 days.  I will also prescribe Lotrisone cream to apply to the area twice a day to help prevent fungal growth as well as to provide some pain relief with a steroid.  We discussed going forward using coconut oil to lubricate his genitals after shower to prevent his skin rubbing together and also the coconut oil should provide some antimicrobial properties.  Return precautions reviewed.   Final Clinical Impressions(s) / UC Diagnoses   Final diagnoses:  Penile ulcer     Discharge Instructions      Take the Bactrim DS twice daily with a full glass of water for 7 days to cover for potential bacterial infection for your penile ulcer.  Apply the Lotrisone twice daily to the ulcer for the next 5 days.  This will help provide antifungal properties as well as provide pain relief as it will decrease the inflammation.  Use coconut oil to lubricate your genitals and perineum following a shower each day.  The coconut oil will help prevent the skin from rubbing together as well as provide  some antibacterial properties.  If you do not have any improvement of your symptoms, you develop increased redness or swelling to the genital region, pus drainage, or fever you need to return for reevaluation.     ED Prescriptions  Medication Sig Dispense Auth. Provider   sulfamethoxazole-trimethoprim (BACTRIM DS) 800-160 MG tablet Take 1 tablet by mouth 2 (two) times daily for 7 days. 14 tablet Becky Augusta, NP   clotrimazole-betamethasone (LOTRISONE) cream Apply to affected area 2 times daily prn 15 g Becky Augusta, NP      PDMP not reviewed this encounter.   Becky Augusta, NP 10/29/23 [phone]

## 2023-10-29 NOTE — Discharge Instructions (Addendum)
Take the Bactrim DS twice daily with a full glass of water for 7 days to cover for potential bacterial infection for your penile ulcer.  Apply the Lotrisone twice daily to the ulcer for the next 5 days.  This will help provide antifungal properties as well as provide pain relief as it will decrease the inflammation.  Use coconut oil to lubricate your genitals and perineum following a shower each day.  The coconut oil will help prevent the skin from rubbing together as well as provide some antibacterial properties.  If you do not have any improvement of your symptoms, you develop increased redness or swelling to the genital region, pus drainage, or fever you need to return for reevaluation.
